# Patient Record
Sex: Male | Born: 1990 | Race: Black or African American | Hispanic: No | Marital: Single | Smoking: Never smoker
Health system: Southern US, Community
[De-identification: ages and names within clinical notes are randomized; demographics above are authoritative.]

---

## 2014-04-05 ENCOUNTER — Emergency Department (HOSPITAL_COMMUNITY): Payer: BC Managed Care – PPO

## 2014-04-05 ENCOUNTER — Encounter (HOSPITAL_COMMUNITY): Payer: Self-pay | Admitting: Emergency Medicine

## 2014-04-05 ENCOUNTER — Emergency Department (HOSPITAL_COMMUNITY)
Admission: EM | Admit: 2014-04-05 | Discharge: 2014-04-05 | Disposition: A | Discharge status: Home / Self Care | Payer: BC Managed Care – PPO | Attending: Emergency Medicine | Admitting: Emergency Medicine

## 2014-04-05 DIAGNOSIS — S99929A Unspecified injury of unspecified foot, initial encounter: Principal | ICD-10-CM

## 2014-04-05 DIAGNOSIS — Y9389 Activity, other specified: Secondary | ICD-10-CM | POA: Insufficient documentation

## 2014-04-05 DIAGNOSIS — S0990XA Unspecified injury of head, initial encounter: Secondary | ICD-10-CM | POA: Insufficient documentation

## 2014-04-05 DIAGNOSIS — M25562 Pain in left knee: Secondary | ICD-10-CM

## 2014-04-05 DIAGNOSIS — S39012A Strain of muscle, fascia and tendon of lower back, initial encounter: Secondary | ICD-10-CM

## 2014-04-05 DIAGNOSIS — Z79899 Other long term (current) drug therapy: Secondary | ICD-10-CM | POA: Insufficient documentation

## 2014-04-05 DIAGNOSIS — Y92410 Unspecified street and highway as the place of occurrence of the external cause: Secondary | ICD-10-CM | POA: Insufficient documentation

## 2014-04-05 DIAGNOSIS — S8990XA Unspecified injury of unspecified lower leg, initial encounter: Secondary | ICD-10-CM | POA: Insufficient documentation

## 2014-04-05 DIAGNOSIS — S99919A Unspecified injury of unspecified ankle, initial encounter: Principal | ICD-10-CM

## 2014-04-05 DIAGNOSIS — S335XXA Sprain of ligaments of lumbar spine, initial encounter: Secondary | ICD-10-CM | POA: Insufficient documentation

## 2014-04-05 MED ORDER — OXYCODONE-ACETAMINOPHEN 5-325 MG PO TABS
2.0000 | ORAL_TABLET | Freq: Once | ORAL | Status: AC
  Administered 2014-04-05: 2 via ORAL
  Filled 2014-04-05: qty 2

## 2014-04-05 MED ORDER — OXYCODONE-ACETAMINOPHEN 5-325 MG PO TABS: ORAL_TABLET | ORAL | Status: DC

## 2014-04-05 NOTE — Discharge Instructions (Signed)
Take percocet for breakthrough pain, do not drink alcohol, drive, care for children or do other critical tasks while taking percocet.  Do not hesitate to return to the Emergency Department for any new, worsening or concerning symptoms.   If you do not have a primary care doctor you can establish one at the   [facility]: [address] [place] [id number] [phone]  After you establish care. Let them know you were seen in the emergency room. They must obtain records for further management.
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
## 2014-04-05 NOTE — ED Provider Notes (Signed)
Medical screening examination/treatment/procedure(s) were performed by non-physician practitioner and as supervising physician I was immediately available for consultation/collaboration.   Linwood DibblesJon Knapp, MD 04/05/14 [phone]
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
## 2014-04-27 ENCOUNTER — Emergency Department (HOSPITAL_COMMUNITY)
Admission: EM | Admit: 2014-04-27 | Discharge: 2014-04-27 | Disposition: A | Discharge status: Home / Self Care | Payer: BC Managed Care – PPO | Attending: Emergency Medicine | Admitting: Emergency Medicine

## 2014-04-27 ENCOUNTER — Encounter (HOSPITAL_COMMUNITY): Payer: Self-pay | Admitting: Emergency Medicine

## 2014-04-27 DIAGNOSIS — M25569 Pain in unspecified knee: Secondary | ICD-10-CM | POA: Insufficient documentation

## 2014-04-27 DIAGNOSIS — S8992XD Unspecified injury of left lower leg, subsequent encounter: Secondary | ICD-10-CM

## 2014-04-27 DIAGNOSIS — G8911 Acute pain due to trauma: Secondary | ICD-10-CM | POA: Insufficient documentation

## 2014-04-27 MED ORDER — OXYCODONE-ACETAMINOPHEN 5-325 MG PO TABS: 1.0000 | ORAL_TABLET | ORAL | Status: AC | PRN

## 2014-04-27 MED ORDER — OXYCODONE-ACETAMINOPHEN 5-325 MG PO TABS
2.0000 | ORAL_TABLET | Freq: Once | ORAL | Status: AC
  Administered 2014-04-27: 2 via ORAL
  Filled 2014-04-27: qty 2

## 2014-04-27 NOTE — Discharge Instructions (Signed)
Take Percocet as needed for pain. Follow up with the recommended orthopedist for further evaluation.
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
## 2014-05-01 NOTE — ED Provider Notes (Signed)
Medical screening examination/treatment/procedure(s) were performed by non-physician practitioner and as supervising physician I was immediately available for consultation/collaboration.   EKG Interpretation None       Hadeel Hillebrand, MD 05/01/14 1317 
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
## 2022-11-10 ENCOUNTER — Encounter (HOSPITAL_COMMUNITY): Payer: Self-pay

## 2022-11-10 ENCOUNTER — Emergency Department (HOSPITAL_COMMUNITY)
Admission: EM | Admit: 2022-11-10 | Discharge: 2022-11-11 | Disposition: A | Discharge status: Home / Self Care | Payer: BC Managed Care – PPO | Attending: Emergency Medicine | Admitting: Emergency Medicine

## 2022-11-10 DIAGNOSIS — R519 Headache, unspecified: Secondary | ICD-10-CM | POA: Insufficient documentation

## 2022-11-10 MED ORDER — KETOROLAC TROMETHAMINE 30 MG/ML IJ SOLN
30.0000 mg | Freq: Once | INTRAMUSCULAR | Status: AC
  Administered 2022-11-11: 30 mg via INTRAMUSCULAR
  Filled 2022-11-10: qty 1

## 2022-11-10 MED ORDER — METOCLOPRAMIDE HCL 5 MG/ML IJ SOLN
10.0000 mg | Freq: Once | INTRAMUSCULAR | Status: AC
  Administered 2022-11-11: 10 mg via INTRAMUSCULAR
  Filled 2022-11-10: qty 2

## 2022-11-10 NOTE — ED Triage Notes (Signed)
Patient arrived with complaints of a migraine that started at 7pm, had relief and started again at 10pm. Last dose of BC powder at 1030pm

## 2022-11-10 NOTE — ED Provider Notes (Signed)
[place] EMERGENCY DEPARTMENT AT [facility] Provider Note   CSN: [id number] Arrival date & time: 11/10/22  2321     History  Chief Complaint  Patient presents with   Migraine         Kipton Skillen is a 32 y.o. male.  The history is provided by the patient and medical records.  Migraine Associated symptoms include headaches.   32 y.o. M with no PMH presenting to the ED with headache.  States it started tonight around 7PM.  Headache localized to left side of forehead and around the eye.  Pain is throbbing in nature with some associated photophobia.  He denies any focal numbness, weakness, blurred vision, dizziness, fever, chills, neck pain.  No hx of migraines in the past.  He does admit that he has had recent lack of sleep over the past 2 nights.  He took [facility] powder around 7:30pm which helped headache a lot, however did not fully resolve it.  Headache has since started to increase again.  Not currently on anticoagulation.    Home Medications Prior to Admission medications   Medication Sig Start Date End Date Taking? Authorizing Provider  oxyCODONE-acetaminophen (PERCOCET/ROXICET) 5-325 MG per tablet Take 1-2 tablets by mouth every 4 (four) hours as needed for moderate pain or severe pain. 04/27/14   Alvina Chou, PA-C      Allergies    Patient has no known allergies.    Review of Systems   Review of Systems  Neurological:  Positive for headaches.  All other systems reviewed and are negative.   Physical Exam Updated Vital Signs BP 139/88 (BP Location: Left Arm)   Pulse (!) 58   Temp 97.8 F (36.6 C) (Oral)   Resp 16   SpO2 98%   Physical Exam Vitals and nursing note reviewed.  Constitutional:      General: He is not in acute distress.    Appearance: He is well-developed. He is not diaphoretic.  HENT:     Head: Normocephalic and atraumatic.     Right Ear: External ear normal.     Left Ear: External ear normal.  Eyes:     Conjunctiva/sclera:  Conjunctivae normal.     Pupils: Pupils are equal, round, and reactive to light.  Neck:     Comments: No rigidity, no meningismus Cardiovascular:     Rate and Rhythm: Normal rate and regular rhythm.     Heart sounds: Normal heart sounds. No murmur heard. Pulmonary:     Effort: Pulmonary effort is normal. No respiratory distress.     Breath sounds: Normal breath sounds. No wheezing or rhonchi.  Abdominal:     General: Bowel sounds are normal.     Palpations: Abdomen is soft.     Tenderness: There is no abdominal tenderness. There is no guarding.  Musculoskeletal:        General: Normal range of motion.     Cervical back: Full passive range of motion without pain, normal range of motion and neck supple. No rigidity.  Skin:    General: Skin is warm and dry.     Findings: No rash.  Neurological:     Mental Status: He is alert and oriented to person, place, and time.     Cranial Nerves: No cranial nerve deficit.     Sensory: No sensory deficit.     Motor: No tremor or seizure activity.     Comments: AAOx3, answering questions and following commands appropriately; equal strength UE and  LE bilaterally; CN grossly intact; moves all extremities appropriately without ataxia; no focal neuro deficits or facial asymmetry appreciated  Psychiatric:        Behavior: Behavior normal.        Thought Content: Thought content normal.     ED Results / Procedures / Treatments   Labs (all labs ordered are listed, but only abnormal results are displayed) Labs Reviewed - No data to display  EKG None  Radiology No results found.  Procedures Procedures    Medications Ordered in ED Medications  ketorolac (TORADOL) 30 MG/ML injection 30 mg (30 mg Intramuscular Given 11/11/22 0007)  metoCLOPramide (REGLAN) injection 10 mg (10 mg Intramuscular Given 11/11/22 0007)    ED Course/ Medical Decision Making/ A&P                             Medical Decision Making Risk Prescription drug  management.   32 year old male presenting to the ED with headache.  Began around 7 PM, better with BC powder but then worsening again.  Pain localized to left temple and around the left eye.  Does admit to some decreased sleep over the past 2 nights.  Afebrile, nontoxic in appearance.  He does not have any focal neurologic deficits.  No meningeal signs.  Suspect this could be migraine versus tension headache or similar.  Will try migraine cocktail to see how he responds.  Will reassess.  1:09 AM Headache has completely resolved.  States he feels great.  Remains neurologically intact.  VSS.  Appropriate for discharge.  Can continue symptomatic care at home if needed, neurology follow-up given if headache become more frequent or persistent.  He can return here for any new or acute changes.  Final Clinical Impression(s) / ED Diagnoses Final diagnoses:  Bad headache    Rx / DC Orders ED Discharge Orders     None         Larene Pickett, PA-C 11/11/22 0109    Fatima Blank, MD 11/11/22 [phone]

## 2022-11-11 NOTE — Discharge Instructions (Signed)
Can try excedrin migraine if headache not responding to usual over the counter medication like tylenol or motrin. I have listed information for local neurology office if headaches become more frequent or persistent.  Can call to set up appt if you like. Can return here for new concerns.
